# Patient Record
Sex: Male | Born: 1965 | Race: White | Hispanic: No | Marital: Married | Smoking: Current every day smoker
Health system: Southern US, Community
[De-identification: ages and names within clinical notes are randomized; demographics above are authoritative.]

## PROBLEM LIST (undated history)

## (undated) DIAGNOSIS — I472 Ventricular tachycardia, unspecified: Secondary | ICD-10-CM

## (undated) DIAGNOSIS — R079 Chest pain, unspecified: Secondary | ICD-10-CM

## (undated) DIAGNOSIS — I471 Supraventricular tachycardia, unspecified: Secondary | ICD-10-CM

## (undated) DIAGNOSIS — I4729 Other ventricular tachycardia: Secondary | ICD-10-CM

## (undated) DIAGNOSIS — I4891 Unspecified atrial fibrillation: Secondary | ICD-10-CM

## (undated) HISTORY — DX: Supraventricular tachycardia: I47.1

## (undated) HISTORY — DX: Supraventricular tachycardia, unspecified: I47.10

## (undated) HISTORY — DX: Ventricular tachycardia, unspecified: I47.20

## (undated) HISTORY — DX: Unspecified atrial fibrillation: I48.91

## (undated) HISTORY — DX: Chest pain, unspecified: R07.9

## (undated) HISTORY — DX: Ventricular tachycardia: I47.2

## (undated) HISTORY — DX: Other ventricular tachycardia: I47.29

---

## 2004-12-30 HISTORY — PX: CARDIAC CATHETERIZATION: SHX172

## 2005-01-24 ENCOUNTER — Inpatient Hospital Stay (HOSPITAL_COMMUNITY): Admission: AD | Admit: 2005-01-24 | Discharge: 2005-01-27 | Payer: Self-pay | Admitting: Cardiology

## 2005-01-24 ENCOUNTER — Ambulatory Visit: Payer: Self-pay | Admitting: Cardiology

## 2005-06-28 ENCOUNTER — Ambulatory Visit: Payer: Self-pay | Admitting: Internal Medicine

## 2014-10-08 ENCOUNTER — Encounter: Payer: Self-pay | Admitting: *Deleted

## 2014-10-09 ENCOUNTER — Encounter: Payer: Self-pay | Admitting: Cardiovascular Disease

## 2014-10-09 ENCOUNTER — Ambulatory Visit (INDEPENDENT_AMBULATORY_CARE_PROVIDER_SITE_OTHER): Payer: Medicaid Other | Admitting: Cardiovascular Disease

## 2014-10-09 VITALS — BP 128/71 | HR 62 | Ht 72.0 in | Wt 191.0 lb

## 2014-10-09 DIAGNOSIS — Z136 Encounter for screening for cardiovascular disorders: Secondary | ICD-10-CM | POA: Diagnosis not present

## 2014-10-09 DIAGNOSIS — R0789 Other chest pain: Secondary | ICD-10-CM | POA: Diagnosis not present

## 2014-10-09 DIAGNOSIS — R002 Palpitations: Secondary | ICD-10-CM | POA: Diagnosis not present

## 2014-10-09 DIAGNOSIS — I471 Supraventricular tachycardia, unspecified: Secondary | ICD-10-CM

## 2014-10-09 DIAGNOSIS — I4891 Unspecified atrial fibrillation: Secondary | ICD-10-CM

## 2014-10-09 DIAGNOSIS — Z720 Tobacco use: Secondary | ICD-10-CM

## 2014-10-09 NOTE — Progress Notes (Signed)
Patient ID: Harry Russell, male   DOB: [birth year], 49 y.o.   MRN: [id number]       CARDIOLOGY CONSULT NOTE  Patient ID: Harry Russell MRN: [id number] DOB/AGE: Oct 06, 1965 49 y.o.  Admit date: (Not on file) Primary Physician Kirstie Peri, MD  Reason for Consultation: chest pain, palpitations  HPI: The patient is a 49 year old male who is referred for the evaluation of chest pressure and palpitations. He reportedly has a history of SVT and atrial fibrillation as per a problem list from the PCPs office. He is currently taking aspirin 81 mg and metoprolol 50 g twice daily.   He underwent a nuclear stress test in September 2015 at [facility] which was normal, LVEF 56%, with good exertional capacity achieving greater than 12 Mets.   He had normal labs on 06/09/14 which included normal CBC, normal basic metabolic panel, normal TSH, and a lipid panel demonstrating total cholesterol 144, triglycerides 69, HDL 40, LDL 90.   He was hospitalized at [facility] in 2006 and it is unclear with respect to the details of that hospitalization. He reportedly underwent coronary angiography in September 2006 but these records are unavailable.   He describes fatigue for the past one year. He has episodes of chest pressure associated with palpitations. He smoked one pack of cigarettes daily for the past 30 years.   He reportedly wore a cardiac monitor in the fall of 2015 ordered by his PCP but these results are unavailable as well. He used to have problems with gastroesophageal reflux disease and took Rolaids regularly but does not have to do so anymore. However, he also experiences chest pressure after eating certain foods.   ECG performed in the office today demonstrates normal sinus rhythm, heart rate 65 bpm, with no ischemic ST segment or T-wave abnormalities.    No Known Allergies  Current Outpatient Prescriptions  Medication Sig Dispense Refill  . aspirin 81 MG tablet Take 81 mg by mouth  daily.    Marland Kitchen ibuprofen (ADVIL,MOTRIN) 200 MG tablet Take 200 mg by mouth every 6 (six) hours as needed.    . metoprolol (LOPRESSOR) 50 MG tablet Take 50 mg by mouth 2 (two) times daily.     No current facility-administered medications for this visit.    Past Medical History  Diagnosis Date  . Atrial fibrillation   . Chest pain   . Paroxysmal supraventricular tachycardia   . Paroxysmal ventricular tachycardia     Past Surgical History  Procedure Laterality Date  . Cardiac catheterization  12/2004    History   Social History  . Marital Status: Married    Spouse Name: N/A  . Number of Children: N/A  . Years of Education: N/A   Occupational History  . Not on file.   Social History Main Topics  . Smoking status: Current Every Day Smoker -- 1.50 packs/day for 25 years    Types: Cigarettes    Start date: 04/06/1978  . Smokeless tobacco: Former Neurosurgeon    Types: Chew    Quit date: 05/02/2003  . Alcohol Use: Not on file  . Drug Use: Not on file  . Sexual Activity: Not on file   Other Topics Concern  . Not on file   Social History Narrative     No family history of premature CAD in 1st degree relatives.  Prior to Admission medications   Medication Sig Start Date End Date Taking? Authorizing Provider  aspirin 81 MG tablet Take 81 mg by mouth daily.   Yes  Historical Provider, MD  ibuprofen (ADVIL,MOTRIN) 200 MG tablet Take 200 mg by mouth every 6 (six) hours as needed.   Yes Historical Provider, MD  metoprolol (LOPRESSOR) 50 MG tablet Take 50 mg by mouth 2 (two) times daily.   Yes Historical Provider, MD     Review of systems complete and found to be negative unless listed above in HPI     Physical exam Blood pressure 128/71, pulse 62, height 6' (1.829 m), weight 191 lb (86.637 kg). General: NAD Neck: No JVD, no thyromegaly or thyroid nodule.  Lungs: Clear to auscultation bilaterally with normal respiratory effort. CV: Nondisplaced PMI. Regular rate and rhythm,  normal S1/S2, no S3/S4, no murmur.  No peripheral edema.  No carotid bruit.  Normal pedal pulses.  Abdomen: Soft, nontender, no hepatosplenomegaly, no distention.  Skin: Intact without lesions or rashes.  Neurologic: Alert and oriented x 3.  Psych: Normal affect. Extremities: No clubbing or cyanosis.  HEENT: Normal.   ECG: Most recent ECG reviewed.  Labs:  No results found for: WBC, HGB, HCT, MCV, PLT No results for input(s): NA, K, CL, CO2, BUN, CREATININE, CALCIUM, PROT, BILITOT, ALKPHOS, ALT, AST, GLUCOSE in the last 168 hours.  Invalid input(s): LABALBU No results found for: CKTOTAL, CKMB, CKMBINDEX, TROPONINI No results found for: CHOL No results found for: HDL No results found for: LDLCALC No results found for: TRIG No results found for: CHOLHDL No results found for: LDLDIRECT       Studies: No results found.  ASSESSMENT AND PLAN:  1. Chest pressure and palpitations: Atypical symptomatology for ischemic heart disease. Normal nuclear stress test in September 2015. I will order a one-week event monitor to assess for tachyarrhythmias such as paroxysmal atrial fibrillation. If he has atrial fibrillation, I would obtain an echocardiogram to assess left atrial size to determine if he is a good candidate for ablation as he appears to be symptomatic from this and his heart rate is already well controlled on metoprolol 50 mg twice daily. I will also try to obtain hospital records from 2006 as well as cardiac monitoring results from the fall of 2015.  Dispo: f/u 6 weeks.  Signed: Prentice Docker, M.D., F.A.C.C.  10/09/2014, 1:44 PM

## 2014-10-09 NOTE — Patient Instructions (Signed)
Your physician has recommended that you wear a 7 day event monitor. Event monitors are medical devices that record the heart's electrical activity. Doctors most often [place] these monitors to diagnose arrhythmias. Arrhythmias are problems with the speed or rhythm of the heartbeat. The monitor is a small, portable device. You can wear one while you do your normal daily activities. This is usually used to diagnose what is causing palpitations/syncope (passing out). Office will contact with results via phone or letter.   Continue all current medications. Follow up in  6 weeks

## 2014-10-20 ENCOUNTER — Encounter (INDEPENDENT_AMBULATORY_CARE_PROVIDER_SITE_OTHER): Payer: Medicaid Other

## 2014-10-20 DIAGNOSIS — R0789 Other chest pain: Secondary | ICD-10-CM | POA: Diagnosis not present

## 2014-10-20 DIAGNOSIS — R002 Palpitations: Secondary | ICD-10-CM

## 2014-10-22 ENCOUNTER — Encounter: Payer: Self-pay | Admitting: *Deleted

## 2014-10-28 ENCOUNTER — Telehealth: Payer: Self-pay | Admitting: *Deleted

## 2014-10-28 NOTE — Telephone Encounter (Signed)
Notes Recorded by Angela G Hill, LPN on 1/61/09606/29/201Lesle Chris6 at 8:44 AM Patient notified. Follow up scheduled for 11/12/2014 with Dr. Purvis SheffieldKoneswaran. Results fwd to PMD.  -------------------------------------------------------------------------------------------------------------------------------  Laqueta LindenSuresh A Koneswaran, MD 10/27/2014 Routine     Narrative & Impression          Normal sinus rhythm. No arrhythmias.

## 2014-11-12 ENCOUNTER — Telehealth: Payer: Self-pay | Admitting: Cardiovascular Disease

## 2014-11-12 ENCOUNTER — Ambulatory Visit (INDEPENDENT_AMBULATORY_CARE_PROVIDER_SITE_OTHER): Payer: Medicaid Other | Admitting: Cardiovascular Disease

## 2014-11-12 ENCOUNTER — Encounter: Payer: Self-pay | Admitting: Cardiovascular Disease

## 2014-11-12 VITALS — BP 118/82 | HR 72 | Ht 72.0 in | Wt 190.0 lb

## 2014-11-12 DIAGNOSIS — R002 Palpitations: Secondary | ICD-10-CM | POA: Diagnosis not present

## 2014-11-12 DIAGNOSIS — I471 Supraventricular tachycardia: Secondary | ICD-10-CM

## 2014-11-12 DIAGNOSIS — R0789 Other chest pain: Secondary | ICD-10-CM | POA: Diagnosis not present

## 2014-11-12 DIAGNOSIS — R0602 Shortness of breath: Secondary | ICD-10-CM | POA: Diagnosis not present

## 2014-11-12 DIAGNOSIS — Z716 Tobacco abuse counseling: Secondary | ICD-10-CM

## 2014-11-12 DIAGNOSIS — R5383 Other fatigue: Secondary | ICD-10-CM | POA: Diagnosis not present

## 2014-11-12 NOTE — Telephone Encounter (Signed)
Echo - SOB, fatigue - [facility]   Scheduled at [facility] 7/18 arrive at 10:15

## 2014-11-12 NOTE — Progress Notes (Signed)
Patient ID: Harry Russell, male   DOB: 10/31/1965, 49 y.o.   MRN: [id number]      SUBJECTIVE: The patient returns for follow-up after undergoing cardiovascular testing performed for the evaluation of chest pressure and palpitations. Event monitoring demonstrated normal sinus rhythm with no arrhythmias.  He underwent a nuclear stress test in September 2015 at [facility] which was normal, LVEF 56%, with good exertional capacity achieving greater than 12 Mets.   He had normal labs on 06/09/14 which included normal CBC, normal basic metabolic panel, normal TSH, and a lipid panel demonstrating total cholesterol 144, triglycerides 69, HDL 40, LDL 90.   He was hospitalized at [facility] in 2006 and it is unclear with respect to the details of that hospitalization. He reportedly underwent coronary angiography in September 2006 but these records are unavailable.   He underwent event monitoring in June 2015 which showed paroxysmal SVT and ventricular bigeminy. He continues to feel short of breath and fatigued with climbing a flight of stairs and used to be on a clamp 45 flights of stairs without difficulty last year. Since he has cut back on his smoking, he has had less palpitations.   Review of Systems: As per "subjective", otherwise negative.  No Known Allergies  Current Outpatient Prescriptions  Medication Sig Dispense Refill  . aspirin 81 MG tablet Take 81 mg by mouth daily.    Marland Kitchen. ibuprofen (ADVIL,MOTRIN) 200 MG tablet Take 200 mg by mouth every 6 (six) hours as needed.    . metoprolol (LOPRESSOR) 50 MG tablet Take 50 mg by mouth 2 (two) times daily.     No current facility-administered medications for this visit.    Past Medical History  Diagnosis Date  . Atrial fibrillation   . Chest pain   . Paroxysmal supraventricular tachycardia   . Paroxysmal ventricular tachycardia     Past Surgical History  Procedure Laterality Date  . Cardiac catheterization  12/2004    History    Social History  . Marital Status: Married    Spouse Name: N/A  . Number of Children: N/A  . Years of Education: N/A   Occupational History  . Not on file.   Social History Main Topics  . Smoking status: Current Every Day Smoker -- 1.50 packs/day for 25 years    Types: Cigarettes    Start date: 04/06/1978  . Smokeless tobacco: Former NeurosurgeonUser    Types: Chew    Quit date: 05/02/2003  . Alcohol Use: Not on file  . Drug Use: Not on file  . Sexual Activity: Not on file   Other Topics Concern  . Not on file   Social History Narrative     Filed Vitals:   11/12/14 0846  BP: 118/82  Pulse: 72  Height: 6' (1.829 m)  Weight: 190 lb (86.183 kg)  SpO2: 97%    PHYSICAL EXAM General: NAD HEENT: Normal. Neck: No JVD, no thyromegaly. Lungs: Clear to auscultation bilaterally with normal respiratory effort. CV: Nondisplaced PMI.  Regular rate and rhythm, normal S1/S2, no S3/S4, no murmur. No pretibial or periankle edema.  No carotid bruit.  Normal pedal pulses.  Abdomen: Soft, nontender, no hepatosplenomegaly, no distention.  Neurologic: Alert and oriented x 3.  Psych: Normal affect. Skin: Normal. Musculoskeletal: Normal range of motion, no gross deformities. Extremities: No clubbing or cyanosis.   ECG: Most recent ECG reviewed.      ASSESSMENT AND PLAN: 1. Chest pressure and exertional dyspnea with fatigue: Normal nuclear stress test in September  2015. Will obtain echocardiogram to assess LV function and regional wall motion. If suspicious or if symptoms persist/progress, would not rule out the possibility of coronary angiography.  2. Palpitations: Event monitor did not demonstrate tachyarrhythmias such as paroxysmal atrial fibrillation or PSVT. Prior event monitoring in June 2015 showed paroxysmal supraventricular tachycardia and ventricular bigeminy. I discussed pursuing an EP study and possible ablation as well as watchful waiting and medical therapy. As his symptoms have  diminished somewhat with reduced cigarette smoking, I encouraged him to pursue this and will hold off on an electrophysiologic evaluation.  3. Tobacco abuse: Cessation counseling provided.  Dispo: f/u 1 month.  Time spent: 40 minutes, of which greater than 50% was spent reviewing symptoms, relevant blood tests and studies, and discussing management plan with the patient.   Prentice Docker, M.D., F.A.C.C.

## 2014-11-12 NOTE — Telephone Encounter (Signed)
No precert required 

## 2014-11-12 NOTE — Patient Instructions (Signed)
Your physician has requested that you have an echocardiogram. Echocardiography is a painless test that uses sound waves to create images of your heart. It provides your doctor with information about the size and shape of your heart and how well your heart's chambers and valves are working. This procedure takes approximately one hour. There are no restrictions for this procedure. Office will contact with results via phone or letter.   Continue all current medications. Follow up in  1 month  

## 2014-11-16 ENCOUNTER — Ambulatory Visit (HOSPITAL_COMMUNITY)
Admission: RE | Admit: 2014-11-16 | Discharge: 2014-11-16 | Disposition: A | Discharge status: Home / Self Care | Payer: Medicaid Other | Source: Ambulatory Visit | Attending: Cardiovascular Disease | Admitting: Cardiovascular Disease

## 2014-11-16 ENCOUNTER — Other Ambulatory Visit (HOSPITAL_COMMUNITY): Payer: Medicaid Other

## 2014-11-16 DIAGNOSIS — R0602 Shortness of breath: Secondary | ICD-10-CM

## 2014-11-16 DIAGNOSIS — R5381 Other malaise: Secondary | ICD-10-CM | POA: Insufficient documentation

## 2014-11-16 DIAGNOSIS — I081 Rheumatic disorders of both mitral and tricuspid valves: Secondary | ICD-10-CM | POA: Diagnosis not present

## 2014-11-16 DIAGNOSIS — R5383 Other fatigue: Secondary | ICD-10-CM

## 2014-12-18 ENCOUNTER — Ambulatory Visit: Payer: Medicaid Other | Admitting: Cardiovascular Disease

## 2017-11-19 ENCOUNTER — Encounter: Payer: Self-pay | Admitting: *Deleted

## 2017-11-20 ENCOUNTER — Other Ambulatory Visit: Payer: Self-pay | Admitting: Cardiology

## 2017-11-20 ENCOUNTER — Telehealth: Payer: Self-pay | Admitting: Cardiology

## 2017-11-20 ENCOUNTER — Encounter: Payer: Self-pay | Admitting: Cardiology

## 2017-11-20 ENCOUNTER — Ambulatory Visit: Payer: Medicaid Other | Admitting: Cardiology

## 2017-11-20 ENCOUNTER — Ambulatory Visit (INDEPENDENT_AMBULATORY_CARE_PROVIDER_SITE_OTHER): Payer: Medicaid Other

## 2017-11-20 VITALS — BP 128/74 | HR 68 | Ht 72.0 in | Wt 194.6 lb

## 2017-11-20 DIAGNOSIS — M79604 Pain in right leg: Secondary | ICD-10-CM

## 2017-11-20 DIAGNOSIS — I739 Peripheral vascular disease, unspecified: Secondary | ICD-10-CM

## 2017-11-20 DIAGNOSIS — R0789 Other chest pain: Secondary | ICD-10-CM

## 2017-11-20 DIAGNOSIS — M79605 Pain in left leg: Secondary | ICD-10-CM

## 2017-11-20 DIAGNOSIS — I471 Supraventricular tachycardia: Secondary | ICD-10-CM | POA: Diagnosis not present

## 2017-11-20 MED ORDER — METOPROLOL TARTRATE 50 MG PO TABS: 75.0000 mg | ORAL_TABLET | Freq: Two times a day (BID) | ORAL | 3 refills | Status: DC

## 2017-11-20 NOTE — Telephone Encounter (Signed)
Pre-cert Verification for the following procedure   VAS US ABI WITH/WO TBI scheduled for 11-20-2017 at [facility]

## 2017-11-20 NOTE — Progress Notes (Signed)
Clinical Summary Harry Russell is a 52 y.o.male last seen by Dr Harry Russell in 2016, seen as new consult today. Referred by Dr Harry Russell for abnormal stress test and chest pain.    1. Chest pain - nonspecific history of several years of chest pain. To me he reports an occasional sharp pain left sided 5/10 in severity primarily after eating, last about 5 minutes - has had some generalized fatigue.  - mild SOB with high levels of activity, such as walking up the hill in his yard. 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  10/2017 Lexiscan [facility]: moderate inducible ischemia inferior septum. Reported as high risk.     2. PSVT - no recent palpitations.    3. Leg pains - bilateral calf pain with walking, pain at 3 blocks.  - former smoke.  Past Medical History:  Diagnosis Date  . Atrial fibrillation (HCC)   . Chest pain   . Paroxysmal supraventricular tachycardia (HCC)   . Paroxysmal ventricular tachycardia (HCC)      No Known Allergies   Current Outpatient Medications  Medication Sig Dispense Refill  . aspirin 81 MG tablet Take 81 mg by mouth daily.    Marland Kitchen ibuprofen (ADVIL,MOTRIN) 200 MG tablet Take 200 mg by mouth every 6 (six) hours as needed.    . metoprolol (LOPRESSOR) 50 MG tablet Take 50 mg by mouth 2 (two) times daily.     No current facility-administered medications for this visit.         No Known Allergies    Family History  Problem Relation Age of Onset  . CVA Mother   . Heart disease Father      Social History Harry Russell reports that he has been smoking cigarettes.  He started smoking about 39 years ago. He has a 37.50 pack-year smoking history. He quit smokeless tobacco use about 14 years ago. His smokeless tobacco use included chew. Harry Russell has no alcohol history on file.   Review of Systems CONSTITUTIONAL: No weight loss, fever, chills, weakness or fatigue.  HEENT: Eyes: No visual loss,  blurred vision, double vision or yellow sclerae.No hearing loss, sneezing, congestion, runny nose or sore throat.  SKIN: No rash or itching.  CARDIOVASCULAR: per hpi RESPIRATORY:per hpi GASTROINTESTINAL: No anorexia, nausea, vomiting or diarrhea. No abdominal pain or blood.  GENITOURINARY: No burning on urination, no polyuria NEUROLOGICAL: No headache, dizziness, syncope, paralysis, ataxia, numbness or tingling in the extremities. No change in bowel or bladder control.  MUSCULOSKELETAL: No muscle, back pain, joint pain or stiffness.  LYMPHATICS: No enlarged nodes. No history of splenectomy.  PSYCHIATRIC: No history of depression or anxiety.  ENDOCRINOLOGIC: No reports of sweating, cold or heat intolerance. No polyuria or polydipsia.  Marland Kitchen   Physical Examination Vitals:   11/20/17 1017  BP: 128/74  Pulse: 68   Vitals:   11/20/17 1017  Weight: 194 lb 9.6 oz (88.3 kg)  Height: 6' (1.829 m)    Gen: resting comfortably, no acute distress HEENT: no scleral icterus, pupils equal round and reactive, no palptable cervical adenopathy,  CV: RRR, no m/r/g, no jvd Resp: Clear to auscultation bilaterally GI: abdomen is soft, non-tender, non-distended, normal bowel sounds, no hepatosplenomegaly MSK: extremities are warm, no edema.  Skin: warm, no rash Neuro:  no focal deficits Psych: appropriate affect     Assessment and Plan  1. Chest pain - very nonspecific description of sharp chest pain primarily after  eating. Generalized fatigue, with some mild SOB with high levels of activity. By history not overally consistent with signifcant CAD - nuclear stress test done by pcp shows moderate size area of mild reversibility, LVEF 52%. I would not classify this findings as high risk, the described defect would suggest mild ischemia, would interpret as low risk - overall low risk though abnormal nuclear stress in setting of very nonspecific symptoms - increase lopressor to 75mg  bid for additional  antianginal effects, monitor symptoms at this time. If progress or change in character consider cath at that time  2. PSVT - controlled, continue beta blocker.   3. Leg pains - suggestive of claudication, check ABIs.   F/u 2 months  Harry Russell, M.D.

## 2017-11-20 NOTE — Patient Instructions (Signed)
Your physician recommends that you schedule a follow-up appointment in: 2 MONTHS [facility]  Your physician has recommended you make the following change in your medication:   INCREASE LOPRESSOR 75 MG (1 AND 1/2 TABLETS) TWICE DAILY  Your physician has requested that you have an ankle brachial index (ABI). During this test an ultrasound and blood pressure cuff are used to evaluate the arteries that supply the arms and legs with blood. Allow thirty minutes for this exam. There are no restrictions or special instructions.  Thank you for choosing [facility]!!

## 2017-11-28 ENCOUNTER — Telehealth: Payer: Self-pay | Admitting: *Deleted

## 2017-11-28 NOTE — Telephone Encounter (Signed)
-----   Message from Antoine PocheJonathan F Branch, MD sent at 11/23/2017  1:38 PM EDT ----- Testing shows normal circulation in his legs. He needs to discuss with pcp possible other causes of his leg pains   Dominga FerryJ Branch MD

## 2017-11-28 NOTE — Telephone Encounter (Signed)
Pt aware - routed to pcp  

## 2018-01-25 ENCOUNTER — Ambulatory Visit (INDEPENDENT_AMBULATORY_CARE_PROVIDER_SITE_OTHER): Payer: Medicaid Other | Admitting: Cardiology

## 2018-01-25 ENCOUNTER — Encounter: Payer: Self-pay | Admitting: Cardiology

## 2018-01-25 ENCOUNTER — Encounter: Payer: Self-pay | Admitting: *Deleted

## 2018-01-25 ENCOUNTER — Ambulatory Visit: Payer: Medicaid Other | Admitting: Cardiology

## 2018-01-25 VITALS — BP 115/72 | HR 48 | Ht 72.0 in | Wt 198.8 lb

## 2018-01-25 DIAGNOSIS — I471 Supraventricular tachycardia: Secondary | ICD-10-CM | POA: Diagnosis not present

## 2018-01-25 DIAGNOSIS — R0789 Other chest pain: Secondary | ICD-10-CM

## 2018-01-25 MED ORDER — AMLODIPINE BESYLATE 2.5 MG PO TABS: 2.5000 mg | ORAL_TABLET | Freq: Every day | ORAL | 1 refills | Status: DC

## 2018-01-25 NOTE — Progress Notes (Signed)
Clinical Summary Harry Russell is a 52 y.o.male seen today for follow up of the following medical problems.    1. Chest pain - nonspecific history of several years of chest pain. To me he reports an occasional sharp pain left sided 5/10 in severity primarily after eating, last about 5 minutes - has had some generalized fatigue.  - mild SOB with high levels of activity, such as walking up the hill in his yard. 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  10/2017 Lexiscan [facility]:  inducible ischemia inferior septum. Reported as high risk.   - last visit increased lopressor to 75mg  bid.  - ongoing symptoms since last visit. Episode few days ago at rest. Dull pain left sided, 3-4/10 in severity. No other symptoms. Not positional. Lasted about 1 hour. Somewhat different location from previous pain.  - less frequent and less severe since last visit. No change in breathing.   2. PSVT - no recent palpitations.    3. Leg pains - bilateral calf pain with walking, pain at 3 blocks.  - still smoking  10/2017 normal ABIs   Past Medical History:  Diagnosis Date  . Atrial fibrillation (HCC)   . Chest pain   . Paroxysmal supraventricular tachycardia (HCC)   . Paroxysmal ventricular tachycardia (HCC)      No Known Allergies   Current Outpatient Medications  Medication Sig Dispense Refill  . aspirin 81 MG tablet Take 81 mg by mouth daily.    . diclofenac (VOLTAREN) 75 MG EC tablet Take 75 mg by mouth 2 (two) times daily.    Marland Kitchen docusate sodium (COLACE) 100 MG capsule Take 100 mg by mouth 2 (two) times daily.    Marland Kitchen ibuprofen (ADVIL,MOTRIN) 200 MG tablet Take 200 mg by mouth every 6 (six) hours as needed.    . metoprolol tartrate (LOPRESSOR) 50 MG tablet Take 1.5 tablets (75 mg total) by mouth 2 (two) times daily. 90 tablet 3   No current facility-administered medications for this visit.      Past Surgical History:  Procedure Laterality  Date  . CARDIAC CATHETERIZATION  12/2004     No Known Allergies    Family History  Problem Relation Age of Onset  . CVA Mother   . Heart disease Father      Social History Mr. Fontan reports that he has been smoking cigarettes. He started smoking about 39 years ago. He has a 37.50 pack-year smoking history. He quit smokeless tobacco use about 14 years ago.  His smokeless tobacco use included chew. Mr. Rayburn has no alcohol history on file.   Review of Systems CONSTITUTIONAL: No weight loss, fever, chills, weakness or fatigue.  HEENT: Eyes: No visual loss, blurred vision, double vision or yellow sclerae.No hearing loss, sneezing, congestion, runny nose or sore throat.  SKIN: No rash or itching.  CARDIOVASCULAR: per hpi RESPIRATORY: per hpi.  GASTROINTESTINAL: No anorexia, nausea, vomiting or diarrhea. No abdominal pain or blood.  GENITOURINARY: No burning on urination, no polyuria NEUROLOGICAL: No headache, dizziness, syncope, paralysis, ataxia, numbness or tingling in the extremities. No change in bowel or bladder control.  MUSCULOSKELETAL: No muscle, back pain, joint pain or stiffness.  LYMPHATICS: No enlarged nodes. No history of splenectomy.  PSYCHIATRIC: No history of depression or anxiety.  ENDOCRINOLOGIC: No reports of sweating, cold or heat intolerance. No polyuria or polydipsia.  Marland Kitchen   Physical Examination Vitals:   01/25/18 1015  BP: 115/72  Pulse: (!) 48   Vitals:   01/25/18 1015  Weight: 198 lb 12.5 oz (90.2 kg)  Height: 6' (1.829 m)    Gen: resting comfortably, no acute distress HEENT: no scleral icterus, pupils equal round and reactive, no palptable cervical adenopathy,  CV: RRR, no m/rg, no jvd Resp: Clear to auscultation bilaterally GI: abdomen is soft, non-tender, non-distended, normal bowel sounds, no hepatosplenomegaly MSK: extremities are warm, no edema.  Skin: warm, no rash Neuro:  no focal deficits Psych: appropriate  affect   Diagnostic Studies 10/2017 ABIs Final Interpretation: Right: Resting right ankle-brachial index is within normal range. No evidence of significant right lower extremity arterial disease. The right toe-brachial index is normal.  Left: Resting left ankle-brachial index is within normal range. No evidence of significant left lower extremity arterial disease. The left toe-brachial index is normal.    Assessment and Plan  1. Chest pain - very nonspecific description of sharp chest pain primarily after eating. Generalized fatigue, with some mild SOB with high levels of activity. By history not overally consistent with signifcant CAD - nuclear stress test done by pcp shows moderate size area of mild reversibility, LVEF 52%. I would not classify this findings as high risk, the described defect would suggest mild ischemia, would interpret as low risk - symptoms improved since last visit, but not resolved. Add norvasc 2.5mg  daily as additional antianginal - if progression of symptoms would consider cath.   2. PSVT - no symptoms, continue beta blocker - EKG sinus brady at 52, no significant symptoms.     F/u 2 months. Request lipid panel from pcp    Antoine Poche, M.D.

## 2018-01-25 NOTE — Patient Instructions (Signed)
Your physician recommends that you schedule a follow-up appointment in:  2 MONTHS WITH DR [facility]  Your physician has recommended you make the following change in your medication:   START AMLODIPINE 2.5 MG DAILY   Thank you for choosing [facility]!!

## 2018-04-03 ENCOUNTER — Encounter: Payer: Self-pay | Admitting: Cardiology

## 2018-04-03 ENCOUNTER — Ambulatory Visit (INDEPENDENT_AMBULATORY_CARE_PROVIDER_SITE_OTHER): Payer: Medicaid Other | Admitting: Cardiology

## 2018-04-03 VITALS — BP 122/78 | HR 49 | Ht 72.0 in | Wt 201.2 lb

## 2018-04-03 DIAGNOSIS — R001 Bradycardia, unspecified: Secondary | ICD-10-CM

## 2018-04-03 DIAGNOSIS — R0789 Other chest pain: Secondary | ICD-10-CM | POA: Diagnosis not present

## 2018-04-03 MED ORDER — ISOSORBIDE MONONITRATE ER 30 MG PO TB24: 15.0000 mg | ORAL_TABLET | Freq: Every day | ORAL | 1 refills | Status: DC

## 2018-04-03 MED ORDER — METOPROLOL TARTRATE 50 MG PO TABS: 50.0000 mg | ORAL_TABLET | Freq: Two times a day (BID) | ORAL | 1 refills | Status: DC

## 2018-04-03 NOTE — Progress Notes (Signed)
Clinical Summary [name] is a 52 y.o.male seen today for follow up of the following medical problems. This is a focused visit on recent chest pain.    1. Chest pain - nonspecific history of several years of chest pain. To me he reports an occasional sharp pain left sided 5/10 in severity primarily after eating, last about 5 minutes - has had some generalized fatigue.  - mild SOB with high levels of activity, such as walking up the hill in his yard. 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 10/2017 Lexiscan [facility]:  inducible ischemia inferior septum. Reported as high risk.      - last visit started norvasc 2.5mg  daily as additional antianginal - started norvasc, symptoms worst with taking. He stopped taking, symptoms improved - infrequent symptoms, occurs daily. Not only associated with eating.  - daily symptoms, 5/10 in severity. Lasts about 10 minutes. Not positional.  - some SOB with activities overall stable.       Past Medical History:  Diagnosis Date  . Atrial fibrillation (HCC)   . Chest pain   . Paroxysmal supraventricular tachycardia (HCC)   . Paroxysmal ventricular tachycardia (HCC)      No Known Allergies   Current Outpatient Medications  Medication Sig Dispense Refill  . amLODipine (NORVASC) 2.5 MG tablet Take 1 tablet (2.5 mg total) by mouth daily. 90 tablet 1  . aspirin 81 MG tablet Take 81 mg by mouth daily.    . diclofenac (VOLTAREN) 75 MG EC tablet Take 75 mg by mouth 2 (two) times daily.    Marland Kitchen. docusate sodium (COLACE) 100 MG capsule Take 100 mg by mouth 2 (two) times daily.    Marland Kitchen. ibuprofen (ADVIL,MOTRIN) 200 MG tablet Take 200 mg by mouth every 6 (six) hours as needed.    . metoprolol tartrate (LOPRESSOR) 50 MG tablet Take 1.5 tablets (75 mg total) by mouth 2 (two) times daily. 90 tablet 3   No current facility-administered medications for this visit.      Past Surgical History:  Procedure  Laterality Date  . CARDIAC CATHETERIZATION  12/2004     No Known Allergies    Family History  Problem Relation Age of Onset  . CVA Mother   . Heart disease Father      Social History [name] reports that he has been smoking cigarettes and e-cigarettes. He started smoking about 40 years ago. He has a 37.50 pack-year smoking history. He quit smokeless tobacco use about 14 years ago.  His smokeless tobacco use included chew. [name] has no alcohol history on file.   Review of Systems CONSTITUTIONAL: No weight loss, fever, chills, weakness or fatigue.  HEENT: Eyes: No visual loss, blurred vision, double vision or yellow sclerae.No hearing loss, sneezing, congestion, runny nose or sore throat.  SKIN: No rash or itching.  CARDIOVASCULAR: per hpi RESPIRATORY: No shortness of breath, cough or sputum.  GASTROINTESTINAL: No anorexia, nausea, vomiting or diarrhea. No abdominal pain or blood.  GENITOURINARY: No burning on urination, no polyuria NEUROLOGICAL: No headache, dizziness, syncope, paralysis, ataxia, numbness or tingling in the extremities. No change in bowel or bladder control.  MUSCULOSKELETAL: No muscle, back pain, joint pain or stiffness.  LYMPHATICS: No enlarged nodes. No history of splenectomy.  PSYCHIATRIC: No history of depression or anxiety.  ENDOCRINOLOGIC: No reports of sweating, cold or heat intolerance. No polyuria or polydipsia.  Marland Kitchen.   Physical Examination Vitals:  04/03/18 0843  BP: 122/78  Pulse: (!) 49  SpO2: 98%   Vitals:   04/03/18 0843  Weight: 201 lb 3.2 oz (91.3 kg)  Height: 6' (1.829 m)    Gen: resting comfortably, no acute distress HEENT: no scleral icterus, pupils equal round and reactive, no palptable cervical adenopathy,  CV: regular, rate 50, no m/r/g, no jvd Resp: Clear to auscultation bilaterally GI: abdomen is soft, non-tender, non-distended, normal bowel sounds, no hepatosplenomegaly MSK: extremities are warm, no edema.    Skin: warm, no rash Neuro:  no focal deficits Psych: appropriate affect   Diagnostic Studies 10/2017 ABIs Final Interpretation: Right: Resting right ankle-brachial index is within normal range. No evidence of significant right lower extremity arterial disease. The right toe-brachial index is normal.  Left: Resting left ankle-brachial index is within normal range. No evidence of significant left lower extremity arterial disease. The left toe-brachial index is normal.    Assessment and Plan  1. Chest pain - very nonspecific description of sharp chest pain primarily after eating. Generalized fatigue, with some mild SOB with high levels of activity. By history not overally consistent with signifcant CAD - nuclear stress test done by pcp shows moderate size area of mild reversibility, LVEF 52%. I would not classify this findings as high risk, the described defect would suggest mild ischemia, would interpret as low risk - symptoms overall stable from last visit. Norvasc seemed to worsen symptoms, we will d/c and try imdur 15mg  daily - if refractory or progressive symptoms would plan for cath.  - normal lipid panel last year, if CAD is ever confirmed by cath would need to start statin.   2. Bradycardia - secondary to lopressor, occasional weakness and fatigue. Lower dose to 50mg  bid.     Antoine Poche, M.D.

## 2018-04-03 NOTE — Patient Instructions (Signed)
Your physician recommends that you schedule a follow-up appointment in: [facility]  Your physician has recommended you make the following change in your medication:   STOP AMLODIPINE   START IMDUR 15MG  (1/2 TABLET) DAILY   DECREASE METOPROLOL 50 MG TWICE DAILY   Thank you for choosing [facility]!!

## 2018-06-06 ENCOUNTER — Encounter: Payer: Self-pay | Admitting: Cardiology

## 2018-06-06 ENCOUNTER — Ambulatory Visit: Payer: Medicaid Other | Admitting: Cardiology

## 2018-06-06 VITALS — BP 120/71 | HR 67 | Ht 72.0 in | Wt 196.6 lb

## 2018-06-06 DIAGNOSIS — R0789 Other chest pain: Secondary | ICD-10-CM

## 2018-06-06 MED ORDER — ISOSORBIDE MONONITRATE ER 30 MG PO TB24: 30.0000 mg | ORAL_TABLET | Freq: Every day | ORAL | 3 refills | Status: DC

## 2018-06-06 NOTE — Progress Notes (Signed)
Clinical Summary Mr. Ghilardi is a 53 y.o.male seen today for a focused visit on recent chest pain and abnormal stress test.   1. Chest pain - nonspecific history of several years of chest pain. To me he reports an occasional sharp pain left sided 5/10 in severity primarily after eating, last about 5 minutes - has had some generalized fatigue.  - mild SOB with high levels of activity, such as walking up the hill in his yard. 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 10/2017 Lexiscan [facility]: inducible ischemia inferior septum. Reported as high risk.    - norvasc worsened symptoms and he stopped taking - last visit started on imdur 15mg  daily - less frequent symptoms, occurs 1-2 times a week. Fairly mild     11/2016 TC 147 TG 44 HDL 54 LDL 84    Past Medical History:  Diagnosis Date  . Atrial fibrillation (HCC)   . Chest pain   . Paroxysmal supraventricular tachycardia (HCC)   . Paroxysmal ventricular tachycardia (HCC)      No Known Allergies   Current Outpatient Medications  Medication Sig Dispense Refill  . aspirin 81 MG tablet Take 81 mg by mouth daily.    . diclofenac (VOLTAREN) 75 MG EC tablet Take 75 mg by mouth 2 (two) times daily.    Marland Kitchen docusate sodium (COLACE) 100 MG capsule Take 100 mg by mouth 2 (two) times daily.    Marland Kitchen ibuprofen (ADVIL,MOTRIN) 200 MG tablet Take 200 mg by mouth every 6 (six) hours as needed.    . isosorbide mononitrate (IMDUR) 30 MG 24 hr tablet Take 0.5 tablets (15 mg total) by mouth daily. 45 tablet 1  . metoprolol tartrate (LOPRESSOR) 50 MG tablet Take 1 tablet (50 mg total) by mouth 2 (two) times daily. 180 tablet 1   No current facility-administered medications for this visit.      Past Surgical History:  Procedure Laterality Date  . CARDIAC CATHETERIZATION  12/2004     No Known Allergies    Family History  Problem Relation Age of Onset  . CVA Mother   . Heart disease  Father      Social History Mr. Amenta reports that he has been smoking cigarettes and e-cigarettes. He started smoking about 40 years ago. He has a 37.50 pack-year smoking history. He quit smokeless tobacco use about 15 years ago.  His smokeless tobacco use included chew. Mr. Menter has no history on file for alcohol.   Review of Systems CONSTITUTIONAL: No weight loss, fever, chills, weakness or fatigue.  HEENT: Eyes: No visual loss, blurred vision, double vision or yellow sclerae.No hearing loss, sneezing, congestion, runny nose or sore throat.  SKIN: No rash or itching.  CARDIOVASCULAR: per hpi RESPIRATORY: No shortness of breath, cough or sputum.  GASTROINTESTINAL: No anorexia, nausea, vomiting or diarrhea. No abdominal pain or blood.  GENITOURINARY: No burning on urination, no polyuria NEUROLOGICAL: No headache, dizziness, syncope, paralysis, ataxia, numbness or tingling in the extremities. No change in bowel or bladder control.  MUSCULOSKELETAL: No muscle, back pain, joint pain or stiffness.  LYMPHATICS: No enlarged nodes. No history of splenectomy.  PSYCHIATRIC: No history of depression or anxiety.  ENDOCRINOLOGIC: No reports of sweating, cold or heat intolerance. No polyuria or polydipsia.  Marland Kitchen   Physical Examination Vitals:   06/06/18 0837  BP: 120/71  Pulse: 67  SpO2: 98%   Vitals:   06/06/18 0837  Weight: 196  lb 9.6 oz (89.2 kg)  Height: 6' (1.829 m)    Gen: resting comfortably, no acute distress HEENT: no scleral icterus, pupils equal round and reactive, no palptable cervical adenopathy,  CV: RRR, no m/r/g no jvd Resp: Clear to auscultation bilaterally GI: abdomen is soft, non-tender, non-distended, normal bowel sounds, no hepatosplenomegaly MSK: extremities are warm, no edema.  Skin: warm, no rash Neuro:  no focal deficits Psych: appropriate affect   Diagnostic Studies 10/2017 ABIs Final Interpretation: Right: Resting right ankle-brachial index is  within normal range. No evidence of significant right lower extremity arterial disease. The right toe-brachial index is normal.  Left: Resting left ankle-brachial index is within normal range. No evidence of significant left lower extremity arterial disease. The left toe-brachial index is normal.     Assessment and Plan   1. Chest pain - very nonspecific description of sharp chest pain primarily after eating. Generalized fatigue, with some mild SOB with high levels of activity. By history not overally consistent with signifcant CAD - nuclear stress test done by pcp shows moderate size area of mild reversibility, LVEF 52%. I would not classify this findings as high risk, the described defect would suggest mild ischemia, would interpret as low risk  - symptoms improved on low dose imdur but not resolved. Increase to 30mg  daily - continue medical therapy, if significant progression of symptoms would consider cath - bradycardia on higher lopressor dosing, would not titrate.    Antoine PocheJonathan F. Tashaun Obey, M.D.,

## 2018-06-06 NOTE — Patient Instructions (Signed)
Medication Instructions:   Increase Imdur to 30mg  DAILY.  Continue all other medications.    Labwork:   Testing/Procedures:   Follow-Up: Your physician wants you to follow up in: 6 months.  You will receive a reminder letter in the mail one-two months in advance.  If you don't receive a letter, please call our office to schedule the follow up appointment   Any Other Special Instructions Will Be Listed Below (If Applicable).  If you need a refill on your cardiac medications before your next appointment, please call your pharmacy.

## 2019-02-18 ENCOUNTER — Other Ambulatory Visit: Payer: Self-pay

## 2019-02-18 DIAGNOSIS — Z20822 Contact with and (suspected) exposure to covid-19: Secondary | ICD-10-CM

## 2019-02-19 LAB — NOVEL CORONAVIRUS, NAA: SARS-CoV-2, NAA: NOT DETECTED

## 2019-02-25 ENCOUNTER — Telehealth: Payer: Self-pay | Admitting: Internal Medicine

## 2019-02-25 NOTE — Telephone Encounter (Signed)
Negative COVID results given. Patient results "NOT Detected." Caller expressed understanding. ° °

## 2019-08-29 ENCOUNTER — Telehealth: Payer: Self-pay | Admitting: Cardiology

## 2019-08-29 NOTE — Telephone Encounter (Signed)
  Patient Consent for Virtual Visit         Harry Russell has provided verbal consent on 08/29/2019 for a virtual visit (video or telephone).   CONSENT FOR VIRTUAL VISIT FOR:  Harry Russell  By participating in this virtual visit I agree to the following:  I hereby voluntarily request, consent and authorize [facility] and its employed or contracted physicians, physician assistants, nurse practitioners or other licensed health care professionals (the Practitioner), to provide me with telemedicine health care services (the "Services") as deemed necessary by the treating Practitioner. I acknowledge and consent to receive the Services by the Practitioner via telemedicine. I understand that the telemedicine visit will involve communicating with the Practitioner through live audiovisual communication technology and the disclosure of certain medical information by electronic transmission. I acknowledge that I have been given the opportunity to request an in-person assessment or other available alternative prior to the telemedicine visit and am voluntarily participating in the telemedicine visit.  I understand that I have the right to withhold or withdraw my consent to the use of telemedicine in the course of my care at any time, without affecting my right to future care or treatment, and that the Practitioner or I may terminate the telemedicine visit at any time. I understand that I have the right to inspect all information obtained and/or recorded in the course of the telemedicine visit and may receive copies of available information for a reasonable fee.  I understand that some of the potential risks of receiving the Services via telemedicine include:  Marland Kitchen Delay or interruption in medical evaluation due to technological equipment failure or disruption; . Information transmitted may not be sufficient (e.g. poor resolution of images) to allow for appropriate medical decision making by the Practitioner;  and/or  . In rare instances, security protocols could fail, causing a breach of personal health information.  Furthermore, I acknowledge that it is my responsibility to provide information about my medical history, conditions and care that is complete and accurate to the best of my ability. I acknowledge that Practitioner's advice, recommendations, and/or decision may be based on factors not within their control, such as incomplete or inaccurate data provided by me or distortions of diagnostic images or specimens that may result from electronic transmissions. I understand that the practice of medicine is not an exact science and that Practitioner makes no warranties or guarantees regarding treatment outcomes. I acknowledge that a copy of this consent can be made available to me via my patient portal [facility] MyChart), or I can request a printed copy by calling the office of [facility].    I understand that my insurance will be billed for this visit.   I have read or had this consent read to me. . I understand the contents of this consent, which adequately explains the benefits and risks of the Services being provided via telemedicine.  . I have been provided ample opportunity to ask questions regarding this consent and the Services and have had my questions answered to my satisfaction. . I give my informed consent for the services to be provided through the use of telemedicine in my medical care

## 2019-09-03 ENCOUNTER — Ambulatory Visit (INDEPENDENT_AMBULATORY_CARE_PROVIDER_SITE_OTHER): Payer: Medicaid Other | Admitting: Cardiology

## 2019-09-03 ENCOUNTER — Encounter: Payer: Self-pay | Admitting: Cardiology

## 2019-09-03 VITALS — Ht 72.0 in | Wt 182.0 lb

## 2019-09-03 DIAGNOSIS — I471 Supraventricular tachycardia: Secondary | ICD-10-CM | POA: Diagnosis not present

## 2019-09-03 DIAGNOSIS — R9439 Abnormal result of other cardiovascular function study: Secondary | ICD-10-CM

## 2019-09-03 DIAGNOSIS — R0789 Other chest pain: Secondary | ICD-10-CM | POA: Diagnosis not present

## 2019-09-03 NOTE — Progress Notes (Signed)
Virtual Visit via Telephone Note   This visit type was conducted due to national recommendations for restrictions regarding the COVID-19 Pandemic (e.g. social distancing) in an effort to limit this patient's exposure and mitigate transmission in our community.  Due to his co-morbid illnesses, this patient is at least at moderate risk for complications without adequate follow up.  This format is felt to be most appropriate for this patient at this time.  The patient did not have access to video technology/had technical difficulties with video requiring transitioning to audio format only (telephone).  All issues noted in this document were discussed and addressed.  No physical exam could be performed with this format.  Please refer to the patient's chart for his  consent to telehealth for [facility].   The patient was identified using 2 identifiers.  Date:  09/03/2019   ID:  Harry Russell, DOB 1965/05/03, MRN [id number]  Patient Location: Home Provider Location: Office  PCP:  Monico Blitz, MD  Cardiologist:  Carlyle Dolly, MD  Electrophysiologist:  None   Evaluation Performed:  Follow-Up Visit  Chief Complaint:  Follow up visit  History of Present Illness:    Harry Russell is a 54 y.o. male seen today for follow up of the following medical problems.   1. Chest pain - nonspecific history of several years of chest pain. To me he reports an occasional sharp pain left sided 5/10 in severity primarily after eating, last about 5 minutes - has had some generalized fatigue.  - mild SOB with high levels of activity, such as walking up the hill in his yard. 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 10/2017 [place]: inducible ischemia inferior septum. Reported as high risk.    - norvasc worsened symptoms and he stopped taking - bradycardia on higher beta blocker dosing - has done well on imdur  - no recent chest pain. No  SOB or DOE - he stopped imdur on his own. No recurrent symptoms off medication.     11/2016 TC 147 TG 44 HDL 54 LDL 84   2. PSVT - denies any recent symptoms   The patient does not have symptoms concerning for COVID-19 infection (fever, chills, cough, or new shortness of breath).    Past Medical History:  Diagnosis Date  . Atrial fibrillation ([place])   . Chest pain   . Paroxysmal supraventricular tachycardia ([place])   . Paroxysmal ventricular tachycardia ([place])    Past Surgical History:  Procedure Laterality Date  . CARDIAC CATHETERIZATION  12/2004     Current Meds  Medication Sig  . aspirin 81 MG tablet Take 81 mg by mouth daily.  Marland Kitchen docusate sodium (COLACE) 100 MG capsule Take 100 mg by mouth 2 (two) times daily.  Marland Kitchen ibuprofen (ADVIL,MOTRIN) 200 MG tablet Take 200 mg by mouth every 6 (six) hours as needed.  . [DISCONTINUED] diclofenac (VOLTAREN) 75 MG EC tablet Take 75 mg by mouth 2 (two) times daily.     Allergies:   Patient has no known allergies.   Social History   Tobacco Use  . Smoking status: Current Every Day Smoker    Packs/day: 1.50    Years: 25.00    Pack years: 37.50    Types: Cigarettes, E-cigarettes    Start date: 04/06/1978  . Smokeless tobacco: Former Systems developer    Types: Chew    Quit date: 05/02/2003  Substance Use Topics  . Alcohol use: Not on file  .  Drug use: Not on file     Family Hx: The patient's family history includes CVA in his mother; Heart disease in his father.  ROS:   Please see the history of present illness.     All other systems reviewed and are negative.   Prior CV studies:   The following studies were reviewed today:    Labs/Other Tests and Data Reviewed:    EKG:  No ECG reviewed.  Recent Labs: No results found for requested labs within last 8760 hours.   Recent Lipid Panel No results found for: CHOL, TRIG, HDL, CHOLHDL, LDLCALC, LDLDIRECT  Wt Readings from Last 3 Encounters:  09/03/19 182 lb (82.6 kg)  06/06/18 196  lb 9.6 oz (89.2 kg)  04/03/18 201 lb 3.2 oz (91.3 kg)     Objective:    Vital Signs:  Ht 6' (1.829 m)   Wt 182 lb (82.6 kg)   BMI 24.68 kg/m    Normal affect. Normal speech pattern and tone. Comfortable, no apparent distress. No audible signs of sob or wheezing. Comfortable, no apparent distress.   ASSESSMENT & PLAN:    1. Chest pain - nuclear stress test done by pcp shows moderate size area of mild reversibility, LVEF 52%. I would not classify this findings as high risk, the described defect would suggest mild ischemia, would interpret as low risk  - he stopped imdur on his own, no recent chest pains off medication - continue to monitor at this time   2. PSVT - no symptoms, continue to monitor.  - he stopped beta blocker on his own, had been on lopressor 50mg  bid - if recurrent symptoms resume medication.   COVID-19 Education: The signs and symptoms of COVID-19 were discussed with the patient and how to seek care for testing (follow up with PCP or arrange E-visit).  The importance of social distancing was discussed today.  Time:   Today, I have spent 18 minutes with the patient with telehealth technology discussing the above problems.     Medication Adjustments/Labs and Tests Ordered: Current medicines are reviewed at length with the patient today.  Concerns regarding medicines are outlined above.   Tests Ordered: No orders of the defined types were placed in this encounter.   Medication Changes: No orders of the defined types were placed in this encounter.   Follow Up:  Either In Person or Virtual in 1 year(s)  Signed, , MD  09/03/2019 8:30 AM

## 2019-09-03 NOTE — Patient Instructions (Signed)

## 2021-02-14 ENCOUNTER — Other Ambulatory Visit: Payer: Self-pay

## 2021-02-14 ENCOUNTER — Ambulatory Visit (INDEPENDENT_AMBULATORY_CARE_PROVIDER_SITE_OTHER): Payer: Medicaid Other | Admitting: Cardiology

## 2021-02-14 ENCOUNTER — Encounter: Payer: Self-pay | Admitting: Cardiology

## 2021-02-14 VITALS — BP 118/64 | HR 70 | Ht 72.0 in | Wt 189.2 lb

## 2021-02-14 DIAGNOSIS — R0789 Other chest pain: Secondary | ICD-10-CM

## 2021-02-14 DIAGNOSIS — I471 Supraventricular tachycardia: Secondary | ICD-10-CM

## 2021-02-14 MED ORDER — METOPROLOL SUCCINATE ER 25 MG PO TB24: 12.5000 mg | ORAL_TABLET | Freq: Every day | ORAL | 3 refills | Status: DC

## 2021-02-14 NOTE — Progress Notes (Signed)
Clinical Summary Mr. Gaumer is a 55 y.o.male seen today for follow up of the following medical problems.    1. Chest pain - nonspecific history of several years of chest pain. To me he reports an occasional sharp pain left sided 5/10 in severity primarily after eating, last about 5 minutes - has had some generalized fatigue.  - mild SOB with high levels of activity, such as walking up the hill in his yard. 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   10/2017 Lexiscan [facility]:  inducible ischemia inferior septum. Reported as high risk.      - norvasc worsened symptoms and he stopped taking - bradycardia on higher beta blocker dosing - had done well on imdur, however stopped the medication on his own   - infrequent chest pains - compliant with meds     11/2016 TC 147 TG 44 HDL 54 LDL 84         2. PSVT - had been lopressor 50mg  bid, he had stopped on his own last year   - occasional palptiations about once a week    Grandaughter with history of congentical heart disease, mulitple prior surgeries. She had a recent cardiac arrest, has had long admission to [facility] with plans to discharge to rehab.   Past Medical History:  Diagnosis Date   Atrial fibrillation (HCC)    Chest pain    Paroxysmal supraventricular tachycardia (HCC)    Paroxysmal ventricular tachycardia (HCC)      No Known Allergies   Current Outpatient Medications  Medication Sig Dispense Refill   aspirin 81 MG tablet Take 81 mg by mouth daily.     docusate sodium (COLACE) 100 MG capsule Take 100 mg by mouth 2 (two) times daily.     ibuprofen (ADVIL,MOTRIN) 200 MG tablet Take 200 mg by mouth every 6 (six) hours as needed.     No current facility-administered medications for this visit.     Past Surgical History:  Procedure Laterality Date   CARDIAC CATHETERIZATION  12/2004     No Known Allergies    Family History  Problem Relation Age  of Onset   CVA Mother    Heart disease Father      Social History Mr. Jares reports that he has been smoking cigarettes and e-cigarettes. He started smoking about 42 years ago. He has a 37.50 pack-year smoking history. He quit smokeless tobacco use about 17 years ago.  His smokeless tobacco use included chew. Mr. Crisp has no history on file for alcohol use.   Review of Systems CONSTITUTIONAL: No weight loss, fever, chills, weakness or fatigue.  HEENT: Eyes: No visual loss, blurred vision, double vision or yellow sclerae.No hearing loss, sneezing, congestion, runny nose or sore throat.  SKIN: No rash or itching.  CARDIOVASCULAR: per hpi RESPIRATORY: No shortness of breath, cough or sputum.  GASTROINTESTINAL: No anorexia, nausea, vomiting or diarrhea. No abdominal pain or blood.  GENITOURINARY: No burning on urination, no polyuria NEUROLOGICAL: No headache, dizziness, syncope, paralysis, ataxia, numbness or tingling in the extremities. No change in bowel or bladder control.  MUSCULOSKELETAL: No muscle, back pain, joint pain or stiffness.  LYMPHATICS: No enlarged nodes. No history of splenectomy.  PSYCHIATRIC: No history of depression or anxiety.  ENDOCRINOLOGIC: No reports of sweating, cold or heat intolerance. No polyuria or polydipsia.  Dareen Piano   Physical Examination Today's Vitals   02/14/21 0828  BP: 118/64  Pulse: 70  SpO2: 98%  Weight: 189 lb 3.2 oz (85.8 kg)  Height: 6' (1.829 m)   Body mass index is 25.66 kg/m.  Gen: resting comfortably, no acute distress HEENT: no scleral icterus, pupils equal round and reactive, no palptable cervical adenopathy,  CV: RRR, no m/r/ gno jvd Resp: Clear to auscultation bilaterally GI: abdomen is soft, non-tender, non-distended, normal bowel sounds, no hepatosplenomegaly MSK: extremities are warm, no edema.  Skin: warm, no rash Neuro:  no focal deficits Psych: appropriate affect   Diagnostic Studies     Assessment and Plan    1. Chest pain - nuclear stress test done by pcp shows moderate size area of mild reversibility, LVEF 52%. I would not classify this findings as high risk, the described defect would suggest mild ischemia, would interpret as low risk   - he stopped imdur on his own, had done well on it - no significant recenty symptoms, contiue to monitor.      2. PSVT - he stopped beta blocker on his own, had been on lopressor 50mg  bid - some recent palpitations, will start toprol 12.5mg  daily and monitor - EKG today shows NSR   F/u 1year. Request pcp labs.     , M.D.

## 2021-02-14 NOTE — Patient Instructions (Signed)
Medication Instructions:  START Toprol XL 12.5 mg daily  *If you need a refill on your cardiac medications before your next appointment, please call your pharmacy*   Lab Work: None today  If you have labs (blood work) drawn today and your tests are completely normal, you will receive your results only by: MyChart Message (if you have MyChart) OR A paper copy in the mail If you have any lab test that is abnormal or we need to change your treatment, we will call you to review the results.   Testing/Procedures: None today    Follow-Up: At [facility], you and your health needs are our priority.  As part of our continuing mission to provide you with exceptional heart care, we have created designated Provider Care Teams.  These Care Teams include your primary Cardiologist (physician) and Advanced Practice Providers (APPs -  Physician Assistants and Nurse Practitioners) who all work together to provide you with the care you need, when you need it.  We recommend signing up for the patient portal called "MyChart".  Sign up information is provided on this After Visit Summary.  MyChart is used to connect with patients for Virtual Visits (Telemedicine).  Patients are able to view lab/test results, encounter notes, upcoming appointments, etc.  Non-urgent messages can be sent to your provider as well.   To learn more about what you can do with MyChart, go to [email or web address].    Your next appointment:   12 month(s)  The format for your next appointment:   In Person  Provider:   Dina Rich, MD   Other Instructions None

## 2022-05-16 ENCOUNTER — Ambulatory Visit: Payer: Medicaid Other | Admitting: Nurse Practitioner

## 2022-05-16 NOTE — Progress Notes (Deleted)
Cardiology Office Note:    Date:  05/16/2022   ID:  Harry Russell, DOB Oct 05, 1965, MRN [id number]  PCP:  Harry Russell, [place] Providers Cardiologist:  Harry Dolly, MD { Click to update primary MD,subspecialty MD or APP then REFRESH:1}    Referring MD: Harry Blitz, MD   No chief complaint on file. ***  History of Present Illness:    Harry Russell is a 57 y.o. male with a hx of ***  A-fib??? PSVT, PVT Hx of chest pain  Patient has had history of nonspecific chest pain for several years.  Underwent Lexi scan in 2019 at [facility] that showed inducible ischemia along the inferior septum, reported as high risk.  Unable to tolerate Norvasc in past as this worsened his symptoms.  And was unable to tolerate beta-blocker at higher dosing as this caused bradycardia.  Has done well on Imdur however he stopped taking medication on his own.     Last seen by Dr. Carlyle Russell on February 14, 2021.  Was compliant with his medications.  Did note infrequent chest pain and occasional palpitations about once per week.  His nuclear stress test that was performed by PCP showed moderate size area of mild reversibility, LVEF 52%.  Dr. Harl Russell stated this would not classify findings as high risk, the defect would suggest mild ischemia, and would interpret this as low risk.  Because he stopped Lopressor on his own, was started on Toprol XL 12.5 mg daily.  Was told to follow-up in 1 year.  Today presents for 1 year follow-up.  He states.     Past Medical History:  Diagnosis Date   Atrial fibrillation ([place])    Chest pain    Paroxysmal supraventricular tachycardia (HCC)    Paroxysmal ventricular tachycardia     Past Surgical History:  Procedure Laterality Date   CARDIAC CATHETERIZATION  12/2004    Current Medications: No outpatient medications have been marked as taking for the 05/16/22 encounter (Appointment) with Harry Bud, NP.     Allergies:   Patient  has no known allergies.   Social History   Socioeconomic History   Marital status: Married    Spouse name: Not on file   Number of children: Not on file   Years of education: Not on file   Highest education level: Not on file  Occupational History   Not on file  Tobacco Use   Smoking status: Every Day    Packs/day: 1.50    Years: 25.00    Total pack years: 37.50    Types: Cigarettes, E-cigarettes    Start date: 04/06/1978   Smokeless tobacco: Former    Types: Chew    Quit date: 05/02/2003  Substance and Sexual Activity   Alcohol use: Not on file   Drug use: Not on file   Sexual activity: Not on file  Other Topics Concern   Not on file  Social History Narrative   Not on file   Social Determinants of Health   Financial Resource Strain: Not on file  Food Insecurity: Not on file  Transportation Needs: Not on file  Physical Activity: Not on file  Stress: Not on file  Social Connections: Not on file     Family History: The patient's ***family history includes CVA in his mother; Heart disease in his father.  ROS:   Please see the history of present illness.    *** All other systems reviewed and are negative.  EKGs/Labs/Other Studies Reviewed:  The following studies were reviewed today: ***  EKG:  EKG is *** ordered today.  The ekg ordered today demonstrates ***  Recent Labs: No results found for requested labs within last 365 days.  Recent Lipid Panel No results found for: "CHOL", "TRIG", "HDL", "CHOLHDL", "VLDL", "LDLCALC", "LDLDIRECT"   Risk Assessment/Calculations:   {Does this patient have ATRIAL FIBRILLATION?:[id number]}  No BP recorded.  {Refresh Note OR Click here to enter BP  :1}***         Physical Exam:    VS:  There were no vitals taken for this visit.    Wt Readings from Last 3 Encounters:  02/14/21 189 lb 3.2 oz (85.8 kg)  09/03/19 182 lb (82.6 kg)  06/06/18 196 lb 9.6 oz (89.2 kg)     GEN: *** Well nourished, well developed in no  acute distress HEENT: Normal NECK: No JVD; No carotid bruits LYMPHATICS: No lymphadenopathy CARDIAC: ***RRR, no murmurs, rubs, gallops RESPIRATORY:  Clear to auscultation without rales, wheezing or rhonchi  ABDOMEN: Soft, non-tender, non-distended MUSCULOSKELETAL:  No edema; No deformity  SKIN: Warm and dry NEUROLOGIC:  Alert and oriented x 3 PSYCHIATRIC:  Normal affect   ASSESSMENT:    No diagnosis found. PLAN:    In order of problems listed above:  ***      {Are you ordering a CV Procedure (e.g. stress test, cath, DCCV, TEE, etc)?   Press F2        :[id number]    Medication Adjustments/Labs and Tests Ordered: Current medicines are reviewed at length with the patient today.  Concerns regarding medicines are outlined above.  No orders of the defined types were placed in this encounter.  No orders of the defined types were placed in this encounter.   There are no Patient Instructions on file for this visit.   Signed, Harry Bud, NP  05/16/2022 7:52 AM    [place]

## 2022-06-22 ENCOUNTER — Encounter: Payer: Self-pay | Admitting: Cardiology

## 2022-07-20 DIAGNOSIS — R059 Cough, unspecified: Secondary | ICD-10-CM | POA: Diagnosis not present

## 2022-07-25 ENCOUNTER — Ambulatory Visit: Payer: 59 | Attending: Nurse Practitioner | Admitting: Nurse Practitioner

## 2022-07-25 ENCOUNTER — Encounter: Payer: Self-pay | Admitting: Nurse Practitioner

## 2022-07-25 ENCOUNTER — Telehealth: Payer: Self-pay | Admitting: Nurse Practitioner

## 2022-07-25 VITALS — BP 146/92 | HR 76 | Ht 72.0 in | Wt 193.6 lb

## 2022-07-25 DIAGNOSIS — Z7289 Other problems related to lifestyle: Secondary | ICD-10-CM

## 2022-07-25 DIAGNOSIS — I10 Essential (primary) hypertension: Secondary | ICD-10-CM | POA: Diagnosis not present

## 2022-07-25 DIAGNOSIS — R002 Palpitations: Secondary | ICD-10-CM

## 2022-07-25 DIAGNOSIS — Z79899 Other long term (current) drug therapy: Secondary | ICD-10-CM

## 2022-07-25 DIAGNOSIS — R5383 Other fatigue: Secondary | ICD-10-CM

## 2022-07-25 DIAGNOSIS — Z87898 Personal history of other specified conditions: Secondary | ICD-10-CM

## 2022-07-25 DIAGNOSIS — I471 Supraventricular tachycardia, unspecified: Secondary | ICD-10-CM | POA: Diagnosis not present

## 2022-07-25 MED ORDER — METOPROLOL TARTRATE 100 MG PO TABS: 100.0000 mg | ORAL_TABLET | Freq: Once | ORAL | 0 refills | Status: DC

## 2022-07-25 MED ORDER — METOPROLOL SUCCINATE ER 25 MG PO TB24: 12.5000 mg | ORAL_TABLET | Freq: Every day | ORAL | 1 refills | Status: DC

## 2022-07-25 NOTE — Addendum Note (Signed)
Addended by: Sung Amabile on: 07/25/2022 09:37 AM   Modules accepted: Orders

## 2022-07-25 NOTE — Progress Notes (Signed)
Office Visit    Patient Name: Harry Russell Date of Encounter: 07/25/2022  PCP:  Monico Blitz, [place]  Cardiologist:  Carlyle Dolly, MD  Advanced Practice Provider:  Finis Bud, NP Electrophysiologist:  None   Chief Complaint    Harry Russell is a 57 y.o. male with a hx of chest pain, PSVT/palpitations, and vaping use, who presents today for overdue 1 year follow-up.    Past Medical History    Past Medical History:  Diagnosis Date   Atrial fibrillation [facility])    Chest pain    Paroxysmal supraventricular tachycardia    Paroxysmal ventricular tachycardia [facility])    Past Surgical History:  Procedure Laterality Date   CARDIAC CATHETERIZATION  12/2004    Allergies  Allergies  Allergen Reactions   Other Swelling    Bees    History of Present Illness    Harry Russell is a 57 y.o. male with a PMH as mentioned above.  Previous cardiovascular history includes Lexiscan in 2019 that was reported at high risk, inducible ischemia along inferior septum.  Last seen by Dr. Carlyle Dolly on February 14, 2021.  Dr. Harl Bowie who reviewed NST stated findings were not be classified as high risk, defect with suggest mild ischemia, would interpret this as low risk.  Patient stopped beta-blocker for PSVT on his own.  Noted some recent palpitations, started on Toprol 12.5 mg daily.  Today he presents for overdue 1 year follow-up. He states he is doing well.  Has noticed his blood pressure has been up recently, has not been consistent with taking Toprol-XL.  Does admit to low energy recently. Denies any chest pain, shortness of breath, palpitations, syncope, presyncope, dizziness, orthopnea, PND, recent swelling or significant weight changes, acute bleeding, or claudication.   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  In his free time he enjoys fishing and hunting.  Denies any alcohol use or illicit drug use.  Does vape, is looking to  quit.  EKGs/Labs/Other Studies Reviewed:   The following studies were reviewed today:   EKG:  EKG is ordered today.  The ekg ordered today demonstrates normal sinus rhythm, 74 bpm, no acute ischemic changes.  Myoview 10/2017: See report.   Echocardiogram in 2016: - Left ventricle: The cavity size was normal. Wall thickness was    increased in a pattern of mild LVH. Systolic function was normal.    The estimated ejection fraction was in the range of 55% to 60%.    Wall motion was normal; there were no regional wall motion    abnormalities. Left ventricular diastolic function parameters    were normal for the patient&'s age.  - Aortic root: The aortic root was mildly dilated.  - Mitral valve: There was mild regurgitation.  - Right atrium: Central venous pressure (est): 3 mm Hg.  - Tricuspid valve: There was trivial regurgitation.  - Pulmonary arteries: PA peak pressure: 24 mm Hg (S).  - Pericardium, extracardiac: There was no pericardial effusion.   Cardiac monitor 09/2014: See report   Recent Labs: No results found for requested labs within last 365 days.  Recent Lipid Panel No results found for: "CHOL", "TRIG", "HDL", "CHOLHDL", "VLDL", "LDLCALC", "LDLDIRECT"   Home Medications   Current Meds  Medication Sig   budesonide-formoterol (SYMBICORT) 160-4.5 MCG/ACT inhaler Inhale 1 puff into the lungs in the morning and at bedtime.   clonazePAM (KLONOPIN) 0.5 MG tablet Take 0.5 mg by mouth 2 (two) times daily  as needed.   ibuprofen (ADVIL,MOTRIN) 200 MG tablet Take 200 mg by mouth every 6 (six) hours as needed.   metoprolol succinate (TOPROL XL) 25 MG 24 hr tablet Take 0.5 tablets (12.5 mg total) by mouth daily.   traZODone (DESYREL) 50 MG tablet Take 25 mg by mouth at bedtime.     Review of Systems    All other systems reviewed and are otherwise negative except as noted above.  Physical Exam    VS:  BP (!) 146/92 (BP Location: Left Arm, Patient Position: Sitting, Cuff  Size: Normal)   Pulse 76   Ht 6' (1.829 m)   Wt 193 lb 9.6 oz (87.8 kg)   SpO2 98%   BMI 26.26 kg/m  , BMI Body mass index is 26.26 kg/m.  Wt Readings from Last 3 Encounters:  07/25/22 193 lb 9.6 oz (87.8 kg)  02/14/21 189 lb 3.2 oz (85.8 kg)  09/03/19 182 lb (82.6 kg)     GEN: Well nourished, well developed, in no acute distress. HEENT: normal. Neck: Supple, no JVD, carotid bruits, or masses. Cardiac: S1/S2, RRR, no murmurs, rubs, or gallops. No clubbing, cyanosis, edema.  Radials/PT 2+ and equal bilaterally.  Respiratory:  Respirations regular and unlabored, clear to auscultation bilaterally. MS: No deformity or atrophy. Skin: Warm and dry, no rash. Neuro:  Strength and sensation are intact. Psych: Normal affect.  Assessment & Plan    Hx of chest pain, low energy/fatigue NST in 2019 suggested mild ischemia, low risk.  Previously stopped Imdur on his own, has done well without it.  Does admit to low energy, denies any chest pain - etiology multifactorial for low energy/fatigue.  Discussed CCTA, and verbalized understanding and is agreeable to proceed.  Will arrange and instructed him to take metoprolol tartrate 100 mg 2 hours prior to testing.  Will check BMET per protocol and arrange 2D Echo. Continue current medication regimen - will refill Toprol XL.  ED precautions discussed. Will request labs from PCP at next OV. Heart healthy diet and regular cardiovascular exercise encouraged.   Palpitations, PSVT Denies any recent palpitations or tachycardia.  Will refill Toprol-XL, and encouraged continued/consistent use. Heart healthy diet and regular cardiovascular exercise encouraged.   HTN Blood pressure on arrival 146/92, repeat BP 142/89.  States SBP has been elevated around 150-160 per his report.  Encouraged continued/consistent use with Toprol-XL - will refill.  Given BP log and discussed to monitor BP at home at least 2 hours after medications and sitting for 5-10 minutes. Heart  healthy diet and regular cardiovascular exercise encouraged.  Will review BP log at next office visit, if SBP is not at goal, plan to initiate low-dose ARB/ACE inhibitor.   Engages in vaping Does admit to vaping daily.  States he plans to quit this.  Smoking/vaping cessation encouraged and discussed.  Verbalized understanding.  Disposition: Follow up in 2-3 month(s) with Carlyle Dolly, MD or APP.  Signed, Finis Bud, NP 07/25/2022, 9:07 AM [place]

## 2022-07-25 NOTE — Telephone Encounter (Signed)
The pt would like to know if he need a paper lab order if he goes to [facility]

## 2022-07-25 NOTE — Telephone Encounter (Signed)
Advised that lab work will be done at [facility].

## 2022-07-25 NOTE — Patient Instructions (Addendum)
Medication Instructions:  Your physician recommends that you continue on your current medications as directed. Please refer to the Current Medication list given to you today.   Labwork: BMP due within 6 weeks of scheduled Coronary CTA  Testing/Procedures: Your physician has requested that you have an echocardiogram. Echocardiography is a painless test that uses sound waves to create images of your heart. It provides your doctor with information about the size and shape of your heart and how well your heart's chambers and valves are working. This procedure takes approximately one hour. There are no restrictions for this procedure. Please do NOT wear cologne, perfume, aftershave, or lotions (deodorant is allowed). Please arrive 15 minutes prior to your appointment time.   Follow-Up:  Your physician recommends that you schedule a follow-up appointment in: 2-3 months  Any Other Special Instructions Will Be Listed Below (If Applicable).  If you need a refill on your cardiac medications before your next appointment, please call your pharmacy.    Your cardiac CT will be scheduled at one of the below locations:   [facility] [address] [place], [place] [address] [phone]  If scheduled at [facility], please arrive at the [facility] and Children's Entrance (Entrance C2) of [facility] 30 minutes prior to test start time. You can use the FREE valet parking offered at entrance C (encouraged to control the heart rate for the test)  Proceed to the [facility] Radiology Department (first floor) to check-in and test prep.  All radiology patients and guests should use entrance C2 at [facility], accessed from [facility], even though the hospital's physical address listed is [address]    Please follow these instructions carefully (unless otherwise directed):  Hold all erectile dysfunction medications at least 3 days (72 hrs)  prior to test. (Ie viagra, cialis, sildenafil, tadalafil, etc) We will administer nitroglycerin during this exam.   On the Night Before the Test: Be sure to Drink plenty of water. Do not consume any caffeinated/decaffeinated beverages or chocolate 12 hours prior to your test. Do not take any antihistamines 12 hours prior to your test.  On the Day of the Test: Drink plenty of water until 1 hour prior to the test. Do not eat any food 4 hours prior to the test. You may take your regular medications prior to the test.  Take metoprolol (Lopressor) 100 mg two hours prior to test.      After the Test: Drink plenty of water. After receiving IV contrast, you may experience a mild flushed feeling. This is normal. On occasion, you may experience a mild rash up to 24 hours after the test. This is not dangerous. If this occurs, you can take Benadryl 25 mg and increase your fluid intake. If you experience trouble breathing, this can be serious. If it is severe call 911 IMMEDIATELY. If it is mild, please call our office. If you take any of these medications: Glipizide/Metformin, Avandament, Glucavance, please do not take 48 hours after completing test unless otherwise instructed.  We will call to schedule your test 2-4 weeks out understanding that some insurance companies will need an authorization prior to the service being performed.   For non-scheduling related questions, please contact the cardiac imaging nurse navigator should you have any questions/concerns: Marchia Bond, Cardiac Imaging Nurse Navigator Gordy Clement, Cardiac Imaging Nurse Navigator [facility] Direct Office Dial: [phone]   For scheduling needs, including cancellations and rescheduling,  please call [place], [phone].

## 2022-07-25 NOTE — Addendum Note (Signed)
Addended by: Sung Amabile on: 07/25/2022 09:19 AM   Modules accepted: Orders

## 2022-07-27 ENCOUNTER — Telehealth: Payer: Self-pay | Admitting: Nurse Practitioner

## 2022-07-27 ENCOUNTER — Other Ambulatory Visit: Payer: 59

## 2022-07-27 ENCOUNTER — Other Ambulatory Visit (HOSPITAL_COMMUNITY)
Admission: RE | Admit: 2022-07-27 | Discharge: 2022-07-27 | Disposition: A | Discharge status: Home / Self Care | Payer: 59 | Source: Ambulatory Visit | Attending: Nurse Practitioner | Admitting: Nurse Practitioner

## 2022-07-27 ENCOUNTER — Telehealth: Payer: Self-pay | Admitting: Cardiology

## 2022-07-27 DIAGNOSIS — Z79899 Other long term (current) drug therapy: Secondary | ICD-10-CM | POA: Diagnosis not present

## 2022-07-27 LAB — BASIC METABOLIC PANEL
Anion gap: 8 (ref 5–15)
BUN: 16 mg/dL (ref 6–20)
CO2: 25 mmol/L (ref 22–32)
Calcium: 9.1 mg/dL (ref 8.9–10.3)
Chloride: 104 mmol/L (ref 98–111)
Creatinine, Ser: 0.77 mg/dL (ref 0.61–1.24)
GFR, Estimated: >60 mL/min (ref >60)
Glucose, Bld: 96 mg/dL (ref 70–99)
Potassium: 3.9 mmol/L (ref 3.5–5.1)
Sodium: 137 mmol/L (ref 135–145)

## 2022-07-27 MED ORDER — METOPROLOL TARTRATE 100 MG PO TABS: 100.0000 mg | ORAL_TABLET | Freq: Once | ORAL | 0 refills | Status: DC

## 2022-07-27 NOTE — Telephone Encounter (Signed)
Patient is waiting to see if his insurance will cover the cost of the coronary CT.

## 2022-07-27 NOTE — Telephone Encounter (Signed)
New Message:    Patient's wife called. She wants to know if it matter what test he has first, his CT or Echo?

## 2022-07-27 NOTE — Telephone Encounter (Signed)
Contacted [facility] and was told that they never received a rx for metoprolol tartrate 100 mg. Prescription resent to pharmacy. Patient made aware.

## 2022-07-27 NOTE — Telephone Encounter (Signed)
*  STAT* If patient is at the pharmacy, call can be transferred to refill team.   1. Which medications need to be refilled? (please list name of each medication and dose if known)  metoprolol tartrate (LOPRESSOR) 100 MG tablet   2. Which pharmacy/location (including street and city if local pharmacy) is medication to be sent to? [id number] [place] [place]   3. Do they need a 30 day or 90 day supply? 1 tablet   Pharmacy states the didn't have medication

## 2022-07-28 ENCOUNTER — Ambulatory Visit: Payer: Medicaid Other | Admitting: Nurse Practitioner

## 2022-07-31 ENCOUNTER — Telehealth (HOSPITAL_COMMUNITY): Payer: Self-pay | Admitting: Emergency Medicine

## 2022-07-31 NOTE — Telephone Encounter (Signed)
Attempted to call patient regarding upcoming cardiac CT appointment. °Left message on voicemail with name and callback number °Krislynn Gronau RN Navigator Cardiac Imaging °[facility] °[phone] Office °[phone] Cell ° °

## 2022-07-31 NOTE — Telephone Encounter (Signed)
Reaching out to patient to offer assistance regarding upcoming cardiac imaging study; pt verbalizes understanding of appt date/time, parking situation and where to check in, pre-test NPO status and medications ordered, and verified current allergies; name and call back number provided for further questions should they arise Xavian Hardcastle RN Navigator Cardiac Imaging [facility] [phone] office [phone] cell 

## 2022-07-31 NOTE — Telephone Encounter (Signed)
Attempted to call patient regarding upcoming cardiac CT appointment. °Left message on voicemail with name and callback number °Lavonna Lampron RN Navigator Cardiac Imaging °[facility] °[phone] Office °[phone] Cell ° °

## 2022-08-01 ENCOUNTER — Ambulatory Visit (HOSPITAL_COMMUNITY)
Admission: RE | Admit: 2022-08-01 | Discharge: 2022-08-01 | Disposition: A | Discharge status: Home / Self Care | Payer: 59 | Source: Ambulatory Visit | Attending: Nurse Practitioner | Admitting: Nurse Practitioner

## 2022-08-01 DIAGNOSIS — R002 Palpitations: Secondary | ICD-10-CM

## 2022-08-01 DIAGNOSIS — I471 Supraventricular tachycardia, unspecified: Secondary | ICD-10-CM

## 2022-08-01 DIAGNOSIS — R072 Precordial pain: Secondary | ICD-10-CM

## 2022-08-01 DIAGNOSIS — Z7289 Other problems related to lifestyle: Secondary | ICD-10-CM

## 2022-08-01 DIAGNOSIS — Z87898 Personal history of other specified conditions: Secondary | ICD-10-CM | POA: Diagnosis not present

## 2022-08-01 DIAGNOSIS — R5383 Other fatigue: Secondary | ICD-10-CM

## 2022-08-01 DIAGNOSIS — I10 Essential (primary) hypertension: Secondary | ICD-10-CM | POA: Insufficient documentation

## 2022-08-01 MED ORDER — NITROGLYCERIN 0.4 MG SL SUBL
0.8000 mg | SUBLINGUAL_TABLET | Freq: Once | SUBLINGUAL | Status: AC
  Administered 2022-08-01: 0.8 mg via SUBLINGUAL

## 2022-08-01 MED ORDER — NITROGLYCERIN 0.4 MG SL SUBL
SUBLINGUAL_TABLET | SUBLINGUAL | Status: AC
  Filled 2022-08-01: qty 2

## 2022-08-01 MED ORDER — IOHEXOL 350 MG/ML SOLN
95.0000 mL | Freq: Once | INTRAVENOUS | Status: AC | PRN
  Administered 2022-08-01: 95 mL via INTRAVENOUS

## 2022-08-09 ENCOUNTER — Telehealth: Payer: Self-pay

## 2022-08-09 DIAGNOSIS — Z79899 Other long term (current) drug therapy: Secondary | ICD-10-CM

## 2022-08-09 NOTE — Telephone Encounter (Signed)
-----   Message from Sharlene Dory, NP sent at 08/07/2022  4:41 PM EDT ----- Minimal CAD noted. Recommend checking FLP and CMET. If he has had recent labs drawn with PCP, please have results faxed to [place]. Would recommend starting low dose statin if necessary.   Sharlene Dory, AGNP-C

## 2022-08-10 DIAGNOSIS — I4891 Unspecified atrial fibrillation: Secondary | ICD-10-CM | POA: Diagnosis not present

## 2022-08-21 ENCOUNTER — Ambulatory Visit: Payer: 59 | Attending: Nurse Practitioner

## 2022-08-21 DIAGNOSIS — R5383 Other fatigue: Secondary | ICD-10-CM

## 2022-08-21 DIAGNOSIS — I081 Rheumatic disorders of both mitral and tricuspid valves: Secondary | ICD-10-CM | POA: Diagnosis not present

## 2022-08-21 LAB — ECHOCARDIOGRAM COMPLETE
AR max vel: 3.18 cm2
AV Area VTI: 3.8 cm2
AV Area mean vel: 3.26 cm2
AV Mean grad: 3 mmHg
AV Peak grad: 6 mmHg
Ao pk vel: 1.22 m/s
Area-P 1/2: 3.34 cm2
Calc EF: 60.4 %
MV M vel: 5.3 m/s
MV Peak grad: 112.4 mmHg
S' Lateral: 3.3 cm
Single Plane A2C EF: 61.5 %
Single Plane A4C EF: 59.4 %

## 2022-09-29 ENCOUNTER — Ambulatory Visit: Payer: 59 | Admitting: Nurse Practitioner

## 2022-10-30 ENCOUNTER — Ambulatory Visit: Payer: 59 | Attending: Nurse Practitioner | Admitting: Nurse Practitioner

## 2022-10-30 ENCOUNTER — Encounter: Payer: Self-pay | Admitting: Nurse Practitioner

## 2022-10-30 VITALS — BP 124/84 | HR 68 | Ht 72.0 in | Wt 199.6 lb

## 2022-10-30 DIAGNOSIS — I38 Endocarditis, valve unspecified: Secondary | ICD-10-CM

## 2022-10-30 DIAGNOSIS — Z720 Tobacco use: Secondary | ICD-10-CM

## 2022-10-30 DIAGNOSIS — Z8679 Personal history of other diseases of the circulatory system: Secondary | ICD-10-CM | POA: Diagnosis not present

## 2022-10-30 DIAGNOSIS — I2510 Atherosclerotic heart disease of native coronary artery without angina pectoris: Secondary | ICD-10-CM

## 2022-10-30 DIAGNOSIS — Z87898 Personal history of other specified conditions: Secondary | ICD-10-CM

## 2022-10-30 MED ORDER — METOPROLOL SUCCINATE ER 25 MG PO TB24: 12.5000 mg | ORAL_TABLET | Freq: Every day | ORAL | 2 refills | Status: AC

## 2022-10-30 NOTE — Patient Instructions (Signed)
Medication Instructions:  Your physician recommends that you continue on your current medications as directed. Please refer to the Current Medication list given to you today.  Labwork: none  Testing/Procedures: none  Follow-Up: Your physician recommends that you schedule a follow-up appointment in: 6 Months with Dr.Branch  Any Other Special Instructions Will Be Listed Below (If Applicable).  If you need a refill on your cardiac medications before your next appointment, please call your pharmacy. 

## 2022-10-30 NOTE — Progress Notes (Signed)
Cardiology Office Note:  .   Date:  10/30/2022  ID:  Beulah Gandy, DOB May 31, 1965, MRN [id number] PCP: Kirstie Peri, MD   [facility] Providers Cardiologist:  Dina Rich, MD Cardiology APP:  Sharlene Dory, NP    History of Present Illness: .   Harry Russell is a 57 y.o. male with a PMH of minimal, nonobstructive CAD, chest pain, PSVT/palpitations, valvular insufficiency, and nicotine use, who presents today for overdue 1 year follow-up.    Previous cardiovascular history includes Lexiscan in 2019 that was reported at high risk, inducible ischemia along inferior septum.   Last seen by me on July 25, 2022. Was doing well, however BP was elevated. Had not been consistent with taking Toprol-XL. Underwent CCTA that revealed coronary calcium score of 60, minimal non-obstructive CAD. Echo report noted below.   Today he presents for follow-up. He states he is doing well. Trying to get off nicotine. Denies any chest pain, shortness of breath, palpitations, syncope, presyncope, dizziness, orthopnea, PND, swelling or significant weight changes, acute bleeding, or claudication.   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 In his free time he enjoys fishing and hunting. Denies any alcohol use or illicit drug use.   Studies Reviewed: .    Echo 07/2022:  1. Left ventricular ejection fraction, by estimation, is 60 to 65%. The  left ventricle has normal function. The left ventricle has no regional  wall motion abnormalities. Left ventricular diastolic parameters are  indeterminate. The average left  ventricular global longitudinal strain is -18.8 %. The global longitudinal  strain is normal.   2. Right ventricular systolic function is normal. The right ventricular  size is normal.   3. The mitral valve is abnormal. Mild mitral valve regurgitation. No  evidence of mitral stenosis.   4. The tricuspid valve is abnormal.   5. The aortic valve is tricuspid. Aortic valve regurgitation is  not  visualized. No aortic stenosis is present.   6. The pulmonic valve was abnormal.   7. The inferior vena cava is normal in size with greater than 50%  respiratory variability, suggesting right atrial pressure of 3 mmHg.   Comparison(s): Echocardiogram done 10/29/17 showed an EF of 55-60%.  CCTA 07/2022:  IMPRESSION: 1. Coronary calcium score of 60. This was 58 percentile for age and sex matched control.   2. Normal coronary origin with right dominance.   3. LAD is a large vessel that has mild calcified focal proximal plaque, 0-24%.   CAD-RADS 1. Minimal non-obstructive CAD (0-24%). Consider non-atherosclerotic causes of chest pain. Consider preventive therapy and risk factor modification.  Physical Exam:   VS:  BP 124/84   Pulse 68   Ht 6' (1.829 m)   Wt 199 lb 9.6 oz (90.5 kg)   SpO2 100%   BMI 27.07 kg/m    Wt Readings from Last 3 Encounters:  10/30/22 199 lb 9.6 oz (90.5 kg)  07/25/22 193 lb 9.6 oz (87.8 kg)  02/14/21 189 lb 3.2 oz (85.8 kg)    GEN: Well nourished, well developed in no acute distress NECK: No JVD; No carotid bruits CARDIAC: S1/S2, RRR, no murmurs, rubs, gallops RESPIRATORY:  Clear to auscultation without rales, wheezing or rhonchi  ABDOMEN: Soft, non-tender, non-distended EXTREMITIES:  No edema; No deformity   ASSESSMENT AND PLAN: .    Minimal, non-obstructive CAD CCTA 07/2022 revealed minimal nonobstructive CAD. Recent lipid panel WNL. Continue current medication regimen. Will refill Toprol XL. Heart healthy diet and regular cardiovascular exercise encouraged.  Hx of PSVT/palpitations Denies any recent palpitations or tachycardia. Continue Toprol XL. Heart healthy diet and regular cardiovascular exercise encouraged.   Valvular insufficiency Echo 07/2022 revealed EF 60-65%, mild MR, mild PR. Denies any symptoms. Plan to repeat Echo in 3-5 years or sooner if clinically indicated.   Nicotine use Smoking cessation encouraged and discussed.    Dispo: Follow-up with Dr. Dina Rich or APP in 6 months or sooner if anything changes.   Signed, Sharlene Dory, NP

## 2023-11-29 ENCOUNTER — Encounter (INDEPENDENT_AMBULATORY_CARE_PROVIDER_SITE_OTHER): Payer: Self-pay | Admitting: *Deleted

## 2023-12-24 ENCOUNTER — Telehealth: Payer: Self-pay

## 2023-12-24 NOTE — Telephone Encounter (Signed)
 Who is your primary care physician: Dr.Shah Eden [place]  Reasons for the colonoscopy: screening  Have you had a colonoscopy before?  Yes 2015  Do you have family history of colon cancer? no  Previous colonoscopy with polyps removed? no  Do you have a history colorectal cancer?   no  Are you diabetic? If yes, Type 1 or Type 2?    no  Do you have a prosthetic or mechanical heart valve? no  Do you have a pacemaker/defibrillator?   no  Have you had endocarditis/atrial fibrillation? no  Have you had joint replacement within the last 12 months?  no  Do you tend to be constipated or have to use laxatives? no  Do you have any history of drugs or alchohol?  no  Do you use supplemental oxygen?  no  Have you had a stroke or heart attack within the last 6 months? no  Do you take weight loss medication?  no   Do you take any blood-thinning medications such as: (aspirin, warfarin, Plavix, Aggrenox)  no  If yes we need the name, milligram, dosage and who is prescribing doctor  Current Outpatient Medications on File Prior to Visit  Medication Sig Dispense Refill   metoprolol  succinate (TOPROL  XL) 25 MG 24 hr tablet Take 0.5 tablets (12.5 mg total) by mouth daily. 45 tablet 2   No current facility-administered medications on file prior to visit.    Allergies  Allergen Reactions   Other Swelling    Bees   Alpha-Gal      Pharmacy: [facility]  Primary Insurance Name: [employer] [id number]  Best number where you can be reached: [phone]

## 2024-01-25 NOTE — Telephone Encounter (Signed)
 Nonosbtructive CAD  ASA 3 - but would like EKG at least a week prior to procedure.

## 2024-01-28 NOTE — Telephone Encounter (Addendum)
 Called number provided on triage and chart and was advised I had the wrong number. Will mail letter to call.

## 2024-03-31 ENCOUNTER — Ambulatory Visit: Attending: Cardiology | Admitting: Cardiology

## 2024-03-31 ENCOUNTER — Encounter: Payer: Self-pay | Admitting: Cardiology

## 2024-03-31 VITALS — BP 132/82 | HR 72 | Ht 72.0 in | Wt 223.0 lb

## 2024-03-31 DIAGNOSIS — Z87898 Personal history of other specified conditions: Secondary | ICD-10-CM

## 2024-03-31 DIAGNOSIS — R0789 Other chest pain: Secondary | ICD-10-CM

## 2024-03-31 MED ORDER — PRAVASTATIN SODIUM 20 MG PO TABS: 20.0000 mg | ORAL_TABLET | Freq: Every evening | ORAL | 6 refills | Status: AC

## 2024-03-31 NOTE — Patient Instructions (Signed)
 Medication Instructions:   Begin Pravastatin 20mg  daily  Continue all other medications.     Labwork:  none  Testing/Procedures:  none  Follow-Up:  Your physician wants you to follow up in:  1 year.  You should receive a recall letter in the mail about 2 months prior to the time you are due.  If you don't receive this, please call our office to schedule your follow up appointment.      Any Other Special Instructions Will Be Listed Below (If Applicable).   If you need a refill on your cardiac medications before your next appointment, please call your pharmacy.

## 2024-03-31 NOTE — Progress Notes (Signed)
 Clinical Summary Harry Russell is a 58 y.o.male seen today for follow up of the following medical problems.    1. Chest pain - nonspecific history of several years of chest pain. To me he reports an occasional sharp pain left sided 5/10 in severity primarily after eating, last about 5 minutes - has had some generalized fatigue.  - mild SOB with high levels of activity, such as walking up the hill in his yard. Works as presenter, broadcasting, has noticed some fatigue with his work but able to still complete.    10/2017 Lexiscan  [facility]:  inducible ischemia inferior septum. Reported as high risk. I would not classify this findings as high risk, the described defect would suggest mild ischemia, would interpret as low risk  07/2022 coronary CTA: calcium score 60, 65th percentile. LAD 0-24% disease.  07/2022 echo: LVEF 60-65%, no WMAs, indet diastolic function.  Mild MR  - norvasc  worsened symptoms and he stopped taking - bradycardia on higher beta blocker dosing - had done well on imdur , however stopped the medication on his own  - no recent chest pains.  - 10/2023 TC 134 TG 89 HLD 33 LDL 84     2. PSVT - had been lopressor  50mg  bid, he had stopped on his own last year   - occasional palptiations about once a week   - denies any palpitations.      Grandaughter with history of congential heart disease, mulitple prior surgeries. She had a recent cardiac arrest, has had long admission to [facility] with plans to discharge to rehab.    Past Medical History:  Diagnosis Date   Atrial fibrillation (HCC)    Chest pain    Paroxysmal supraventricular tachycardia    Paroxysmal ventricular tachycardia (HCC)      Allergies  Allergen Reactions   Other Swelling    Bees   Alpha-Gal      Current Outpatient Medications  Medication Sig Dispense Refill   metoprolol  succinate (TOPROL  XL) 25 MG 24 hr tablet Take 0.5 tablets (12.5 mg total) by mouth daily. 45 tablet 2   No current  facility-administered medications for this visit.     Past Surgical History:  Procedure Laterality Date   CARDIAC CATHETERIZATION  12/2004     Allergies  Allergen Reactions   Other Swelling    Bees   Alpha-Gal       Family History  Problem Relation Age of Onset   CVA Mother    Heart disease Father      Social History Harry Russell reports that he has been smoking cigarettes and e-cigarettes. He started smoking about 46 years ago. He has a 69 pack-year smoking history. He has never been exposed to tobacco smoke. He quit smokeless tobacco use about 20 years ago.  His smokeless tobacco use included chew. Harry Russell has no history on file for alcohol use.   Review of Systems CONSTITUTIONAL: No weight loss, fever, chills, weakness or fatigue.  HEENT: Eyes: No visual loss, blurred vision, double vision or yellow sclerae.No hearing loss, sneezing, congestion, runny nose or sore throat.  SKIN: No rash or itching.  CARDIOVASCULAR:  RESPIRATORY: No shortness of breath, cough or sputum.  GASTROINTESTINAL: No anorexia, nausea, vomiting or diarrhea. No abdominal pain or blood.  GENITOURINARY: No burning on urination, no polyuria NEUROLOGICAL: No headache, dizziness, syncope, paralysis, ataxia, numbness or tingling in the extremities. No change in bowel or bladder control.  MUSCULOSKELETAL: No muscle, back pain, joint pain  or stiffness.  LYMPHATICS: No enlarged nodes. No history of splenectomy.  PSYCHIATRIC: No history of depression or anxiety.  ENDOCRINOLOGIC: No reports of sweating, cold or heat intolerance. No polyuria or polydipsia.  [name]   Physical Examination Vitals:   03/31/24 0824 03/31/24 0848  BP: (!) 144/94 132/82  Pulse: 72   SpO2: 96%    Filed Weights   03/31/24 0824  Weight: 223 lb (101.2 kg)    Gen: resting comfortably, no acute distress HEENT: no scleral icterus, pupils equal round and reactive, no palptable cervical adenopathy,  CV Resp: Clear to  auscultation bilaterally GI: abdomen is soft, non-tender, non-distended, normal bowel sounds, no hepatosplenomegaly MSK: extremities are warm, no edema.  Skin: warm, no rash Neuro:  no focal deficits Psych: appropriate affect   Diagnostic Studies  07/2022 coronary CTA 1. Coronary calcium score of 60. This was 88 percentile for age and sex matched control.   2. Normal coronary origin with right dominance.   3. LAD is a large vessel that has mild calcified focal proximal plaque, 0-24%.   CAD-RADS 1. Minimal non-obstructive CAD (0-24%). Consider non-atherosclerotic causes of chest pain. Consider preventive therapy and risk factor modification.   07/2022 echo 1. Left ventricular ejection fraction, by estimation, is 60 to 65%. The  left ventricle has normal function. The left ventricle has no regional  wall motion abnormalities. Left ventricular diastolic parameters are  indeterminate. The average left  ventricular global longitudinal strain is -18.8 %. The global longitudinal  strain is normal.   2. Right ventricular systolic function is normal. The right ventricular  size is normal.   3. The mitral valve is abnormal. Mild mitral valve regurgitation. No  evidence of mitral stenosis.   4. The tricuspid valve is abnormal.   5. The aortic valve is tricuspid. Aortic valve regurgitation is not  visualized. No aortic stenosis is present.   6. The pulmonic valve was abnormal.   7. The inferior vena cava is normal in size with greater than 50%  respiratory variability, suggesting right atrial pressure of 3 mmHg.    Assessment and Plan   1. Chest pain - no recent symptoms - coronary CTA last year with very mild LAD disease, coronary calcium score of 60.  - LDL is 84, we discussed starting a statin for the pleiotropic effects given his evidence of mild CAD and elevated coronary calcium score. He agrees to start pravastatin 20mg  daily.    - of note chronic leg pains prior to statin.       2. PSVT - no symptoms - EKG today shows NSR - continue current meds  Fu 1 year          Harry Russell, M.D.
